# Patient Record
Sex: Male | Born: 1995 | Race: White | Hispanic: No | Marital: Single | Smoking: Never smoker
Health system: Southern US, Community
[De-identification: ages and names within clinical notes are randomized; demographics above are authoritative.]

## PROBLEM LIST (undated history)

## (undated) SURGERY — Surgical Case
Anesthesia: *Unknown

---
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
## 2016-05-10 IMAGING — CT CT ABD-PELV W/ CM
2 of 4 series · 16 of 46 positions shown, 18 images · IV contrast (omnipaque)
Comparison: Abdominal radiograph performed 01/28/2015

CLINICAL DATA: Acute onset of right lower quadrant abdominal pain.
Initial encounter.

EXAM:
CT ABDOMEN AND PELVIS WITH CONTRAST
TECHNIQUE: Multidetector CT imaging of the abdomen and pelvis was performed
using the standard protocol following bolus administration of
intravenous contrast.
CONTRAST:  100mL OMNIPAQUE IOHEXOL 300 MG/ML  SOLN

[Series 2: abd/pel with · axial · 0.74mm/px · z∈[-502,-92]mm · 13 of 90 slices shown, 15 images]
[im 4/90  soft-tissue]
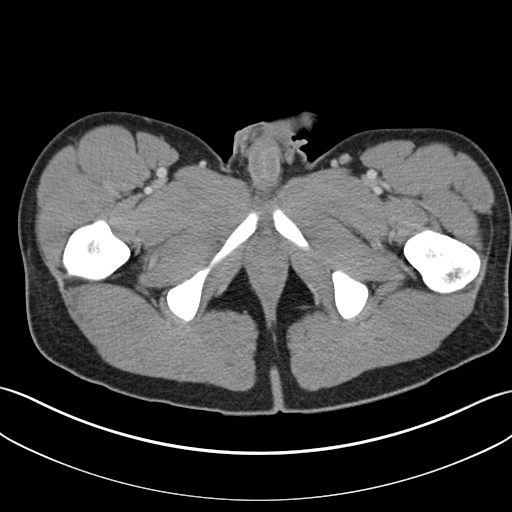
[im 4/90  bone]
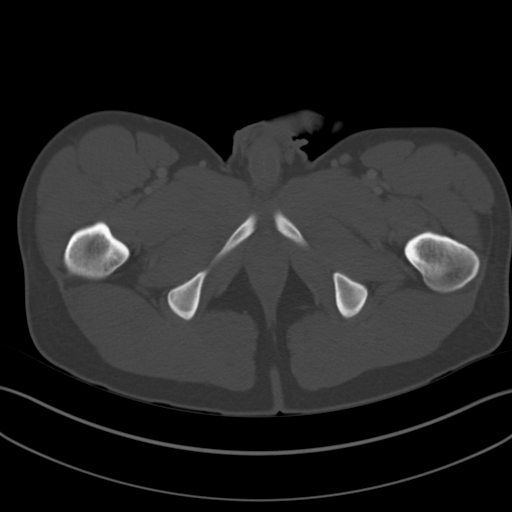
[im 12/90  soft-tissue]
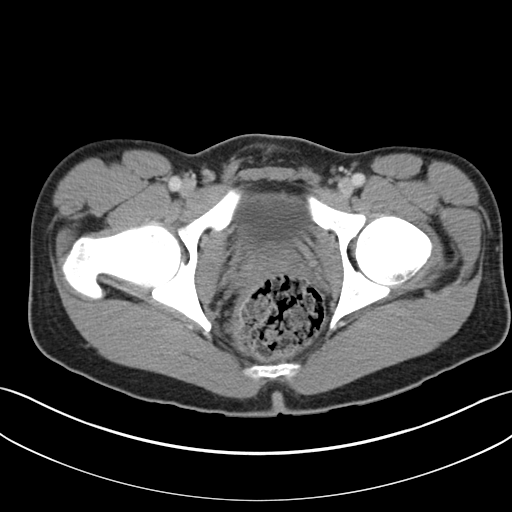
[im 19/90  soft-tissue]
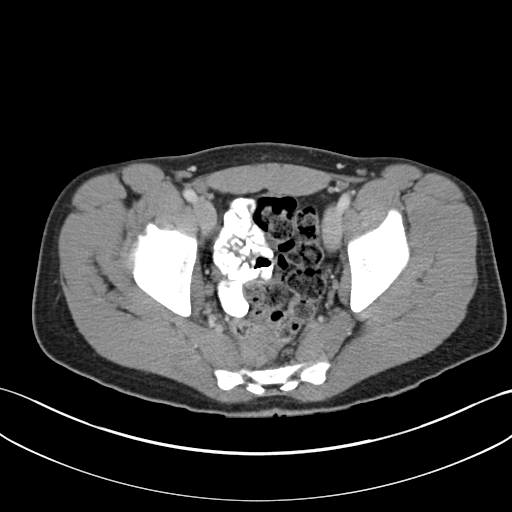
[im 26/90  soft-tissue]
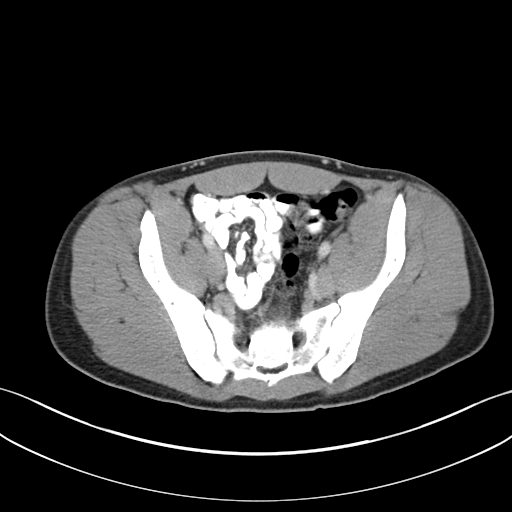
[im 30/90  soft-tissue]
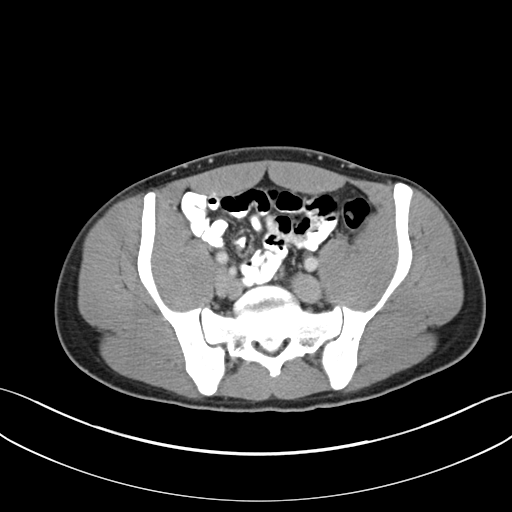
[im 38/90  soft-tissue]
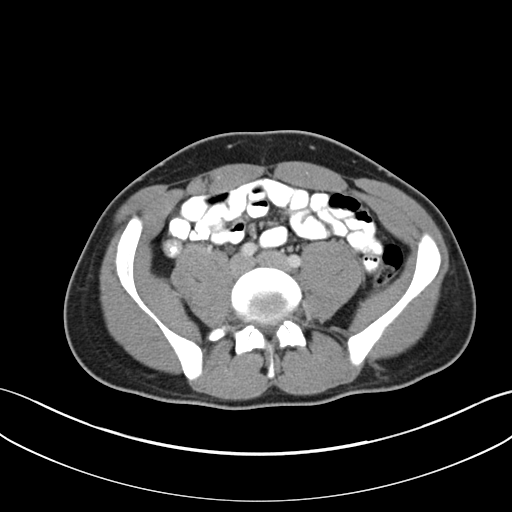
[im 45/90  soft-tissue]
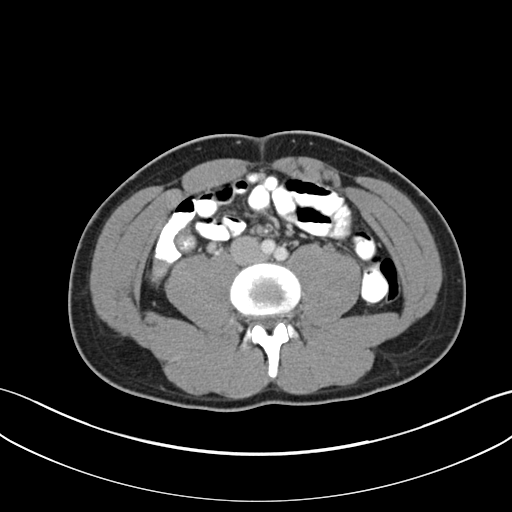
[im 52/90  soft-tissue]
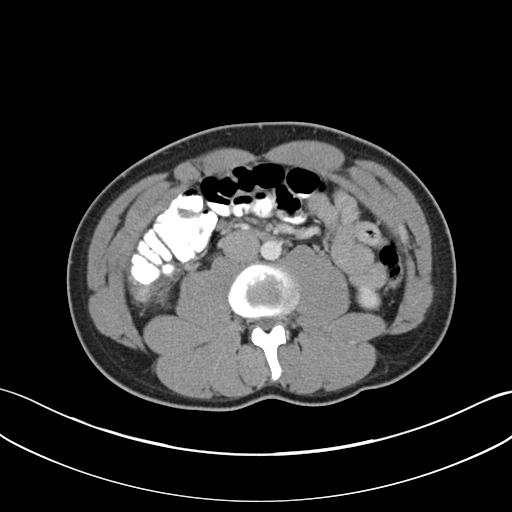
[im 60/90  soft-tissue]
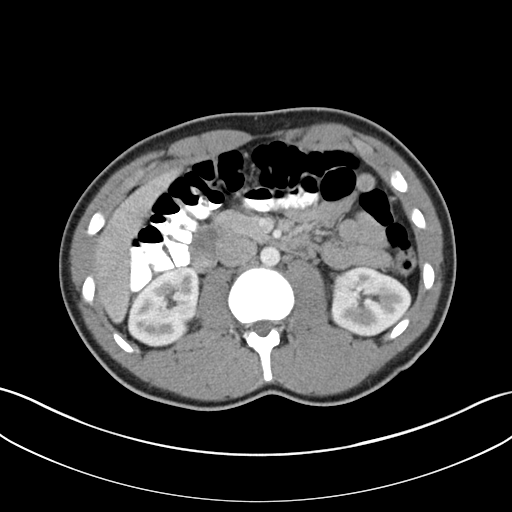
[im 60/90  bone]
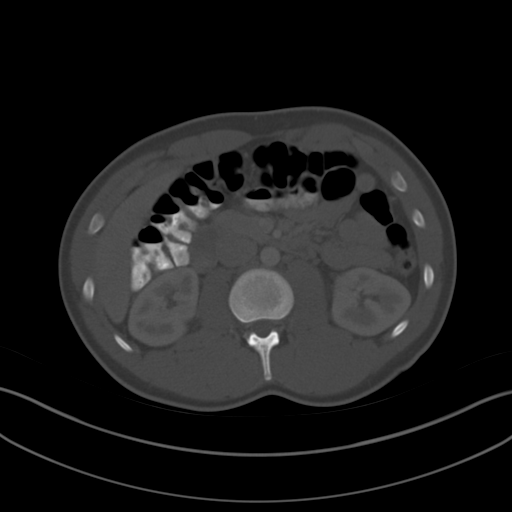
[im 64/90  soft-tissue]
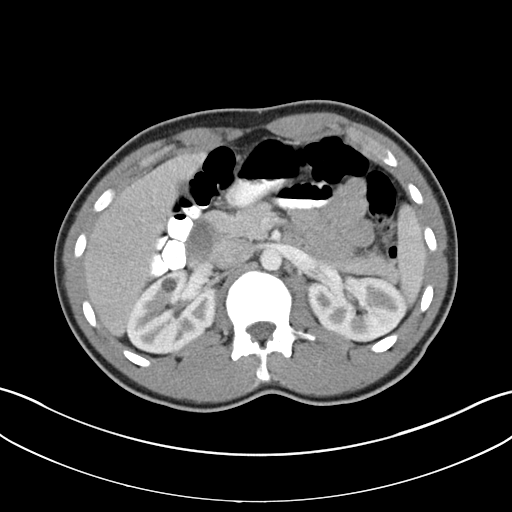
[im 71/90  soft-tissue]
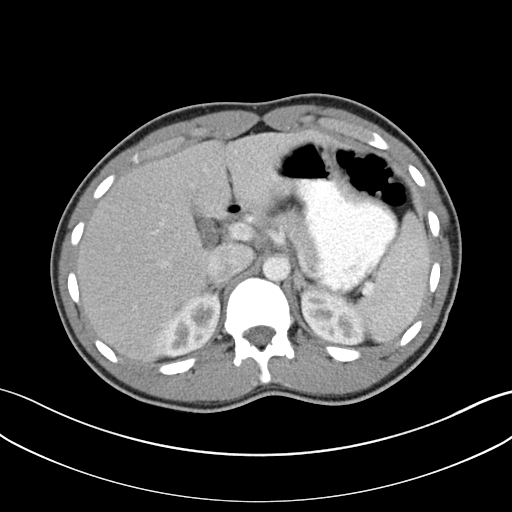
[im 78/90  soft-tissue]
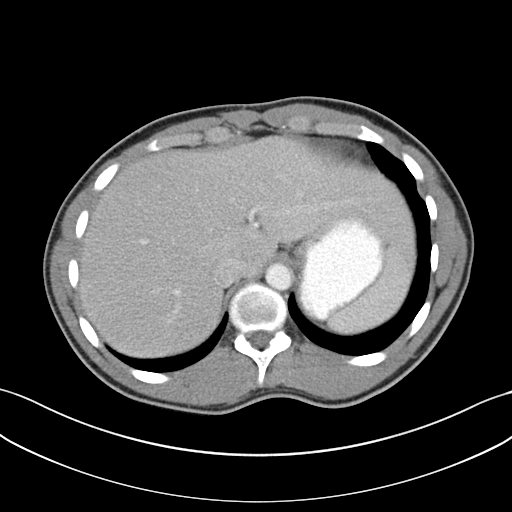
[im 86/90  soft-tissue]
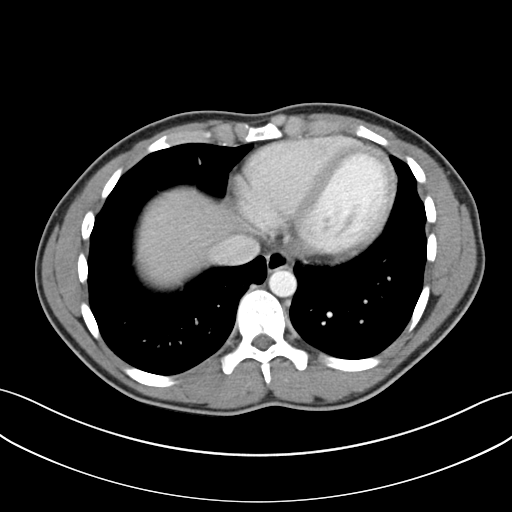

[Series 5: coronal a/|p · coronal · 0.72mm/px · 3 of 80 slices shown]
[im 27/80  soft-tissue]
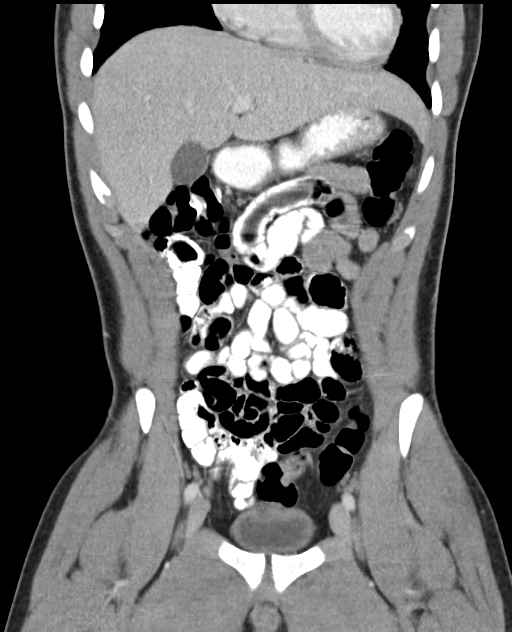
[im 36/80  soft-tissue]
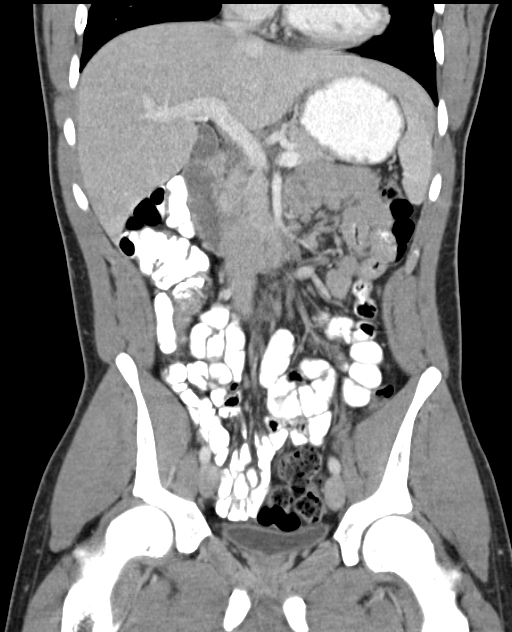
[im 44/80  soft-tissue]
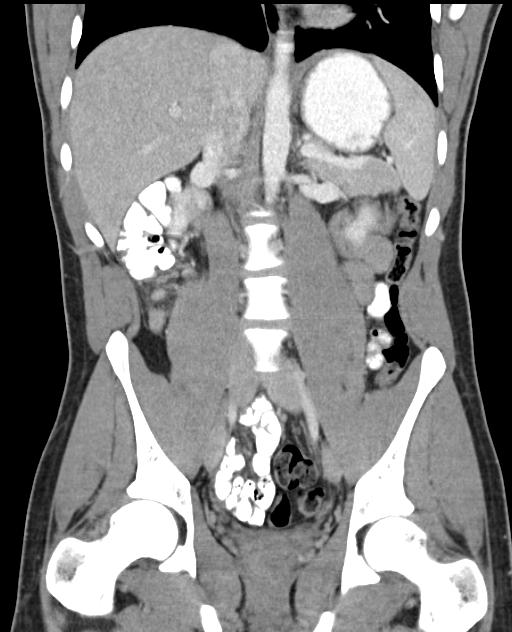

[16 of 46 positions shown; findings below may reference images not displayed]

FINDINGS: The visualized lung bases are clear.

The liver and spleen are unremarkable in appearance. The gallbladder
is within normal limits. The pancreas and adrenal glands are
unremarkable.

1.2 cm cyst is noted at the anterior aspect of the right kidney. The
kidneys are otherwise unremarkable. There is no evidence of
hydronephrosis. No renal or ureteral stones are seen. No perinephric
stranding is appreciated.

The small bowel is unremarkable in appearance. The stomach is within
normal limits. No acute vascular abnormalities are seen.

The appendix is dilated to 1.3 cm in diameter, with diffuse wall
thickening, and surrounding soft tissue inflammation and trace
fluid. The appendix is retrocecal in nature, and extends nearly to
the distal tip of the liver. Findings are compatible with acute
appendicitis. There is no evidence of perforation or abscess
formation at this time.

Contrast progresses to the level of the hepatic flexure of the
colon. The colon is unremarkable in appearance.

The bladder is mildly distended and grossly unremarkable. The
prostate remains normal in size. No inguinal lymphadenopathy is
seen.

No acute osseous abnormalities are identified.
IMPRESSION: 1. Acute appendicitis, with dilatation of the appendix to 1.3 cm in
maximal diameter, diffuse wall thickening, and surrounding soft
tissue inflammation and trace fluid. The appendix is retrocecal in
nature, and extends nearly to the distal tip of the liver. No
evidence of perforation or abscess formation at this time.
2. Small right renal cyst noted.
These results were called by telephone at the time of interpretation
on 01/29/2015 at [DATE] to Dr. Aldana, who verbally acknowledged
these results.
